# Patient Record
Sex: Female | Born: 1965 | Hispanic: Yes | Marital: Married | Smoking: Never smoker
Health system: Southern US, Community
[De-identification: ages and names within clinical notes are randomized; demographics above are authoritative.]

## PROBLEM LIST (undated history)

## (undated) HISTORY — PX: BREAST REDUCTION SURGERY: SHX8

## (undated) HISTORY — PX: OTHER SURGICAL HISTORY: SHX169

---

## 2014-02-19 ENCOUNTER — Encounter (HOSPITAL_COMMUNITY): Payer: Self-pay | Admitting: Emergency Medicine

## 2014-02-19 ENCOUNTER — Emergency Department (HOSPITAL_COMMUNITY): Payer: Medicaid - Out of State

## 2014-02-19 ENCOUNTER — Emergency Department (HOSPITAL_COMMUNITY)
Admission: EM | Admit: 2014-02-19 | Discharge: 2014-02-20 | Disposition: A | Discharge status: Home / Self Care | Payer: Medicaid - Out of State | Attending: Emergency Medicine | Admitting: Emergency Medicine

## 2014-02-19 DIAGNOSIS — R002 Palpitations: Secondary | ICD-10-CM | POA: Diagnosis present

## 2014-02-19 DIAGNOSIS — Z3202 Encounter for pregnancy test, result negative: Secondary | ICD-10-CM | POA: Insufficient documentation

## 2014-02-19 DIAGNOSIS — F419 Anxiety disorder, unspecified: Secondary | ICD-10-CM | POA: Insufficient documentation

## 2014-02-19 DIAGNOSIS — R0789 Other chest pain: Secondary | ICD-10-CM | POA: Diagnosis not present

## 2014-02-19 LAB — CBC WITH DIFFERENTIAL/PLATELET
Basophils Absolute: 0.1 10*3/uL (ref 0.0–0.1)
Basophils Relative: 1 % (ref 0–1)
Eosinophils Absolute: 0.4 10*3/uL (ref 0.0–0.7)
Eosinophils Relative: 4 % (ref 0–5)
HEMATOCRIT: 40.3 % (ref 36.0–46.0)
Hemoglobin: 12.9 g/dL (ref 12.0–15.0)
Lymphocytes Relative: 39 % (ref 12–46)
Lymphs Abs: 4.1 10*3/uL — ABNORMAL HIGH (ref 0.7–4.0)
MCH: 28.4 pg (ref 26.0–34.0)
MCHC: 32 g/dL (ref 30.0–36.0)
MCV: 88.8 fL (ref 78.0–100.0)
Monocytes Absolute: 0.8 10*3/uL (ref 0.1–1.0)
Monocytes Relative: 8 % (ref 3–12)
Neutro Abs: 4.9 10*3/uL (ref 1.7–7.7)
Neutrophils Relative %: 48 % (ref 43–77)
PLATELETS: 219 10*3/uL (ref 150–400)
RBC: 4.54 MIL/uL (ref 3.87–5.11)
RDW: 13.7 % (ref 11.5–15.5)
WBC: 10.3 10*3/uL (ref 4.0–10.5)

## 2014-02-19 LAB — BASIC METABOLIC PANEL
Anion gap: 11 (ref 5–15)
BUN: 10 mg/dL (ref 6–23)
CALCIUM: 9.3 mg/dL (ref 8.4–10.5)
CO2: 25 mEq/L (ref 19–32)
Chloride: 106 mEq/L (ref 96–112)
Creatinine, Ser: 0.64 mg/dL (ref 0.50–1.10)
GFR calc Af Amer: >90 mL/min (ref >90)
GFR calc non Af Amer: >90 mL/min (ref >90)
Glucose, Bld: 102 mg/dL — ABNORMAL HIGH (ref 70–99)
Potassium: 4 mEq/L (ref 3.7–5.3)
SODIUM: 142 meq/L (ref 137–147)

## 2014-02-19 LAB — I-STAT TROPONIN, ED: Troponin i, poc: 0 ng/mL (ref 0.00–0.08)

## 2014-02-19 MED ORDER — ACETAMINOPHEN 500 MG PO TABS
1000.0000 mg | ORAL_TABLET | Freq: Once | ORAL | Status: AC
  Administered 2014-02-19: 1000 mg via ORAL
  Filled 2014-02-19: qty 2

## 2014-02-19 NOTE — ED Notes (Signed)
Pt states for the past week she has been having heart palpitations, hot flashes where her face gets very hot, and pain in her left shoulder area and arm  Pt denies nausea or shortness of breath

## 2014-02-19 NOTE — ED Provider Notes (Signed)
CSN: [id number]     Arrival date & time 02/19/14  2033 History   First MD Initiated Contact with Patient 02/19/14 2201     Chief Complaint  Patient presents with  . Palpitations     (Consider location/radiation/quality/duration/timing/severity/associated sxs/prior Treatment) HPI Mrs. Beverly Phillips is a 48 year old female with no past medical history who reports having heart palpitations, chest discomfort with inspiration, and flushing of her face for approximately one week. Patient states these episodes have been intermittent, and have had a tendency to occur more frequently at night when she is trying to go to sleep. Patient also reports associated insomnia, and states that she recently moved from [place]. Patient states she is away from her family, and as he thinks about being away from home, and a new place, she tends to have more of these symptoms.patient states that when she lived in [place], she experienced these symptoms on several occasions. Patient states they were related to her anxiety at that time, however has not had these symptoms in several years until moving here recently.patient states her chest discomfort is in the center of her chest, and is only present with inspiration. She states she notices her heart beat when she lays flat at night. Patient states she has not noticed or been able to find any alleviating factors. Patient denies dizziness, lightheadedness, syncope, blurred vision, shortness of breath, nausea, vomiting, diarrhea, abdominal pain, dysuria. Patient reports having frequency of urination recently.  History reviewed. No pertinent past medical history. Past Surgical History  Procedure Laterality Date  . Breast reduction surgery    . Tummy tuck    . Cesarean section     Family History  Problem Relation Age of Onset  . Hypertension Mother   . Diabetes Father    History  Substance Use Topics  . Smoking status: Never Smoker   . Smokeless tobacco: Not on file  .  Alcohol Use: Yes     Comment: occ   OB History    No data available     Review of Systems  Constitutional: Negative for fever.  HENT: Negative for trouble swallowing.   Eyes: Negative for visual disturbance.  Respiratory: Positive for chest tightness. Negative for shortness of breath.   Cardiovascular: Positive for palpitations. Negative for chest pain.  Gastrointestinal: Negative for nausea, vomiting and abdominal pain.  Genitourinary: Negative for dysuria.  Musculoskeletal: Negative for neck pain.  Skin: Negative for rash.  Neurological: Negative for dizziness, weakness and numbness.  Psychiatric/Behavioral: Negative.       Allergies  Review of patient's allergies indicates no known allergies.  Home Medications   Prior to Admission medications   Medication Sig Start Date End Date Taking? Authorizing Provider  LORazepam (ATIVAN) 1 MG tablet Take 1 tablet (1 mg total) by mouth every 6 (six) hours as needed for anxiety. 02/20/14   Monte FantasiaJoseph W Delbra Zellars, PA-C   BP 110/68 mmHg  Pulse 82  Temp(Src) 98.1 F (36.7 C) (Oral)  Resp 20  SpO2 96%  LMP  Physical Exam  Constitutional: She appears well-developed and well-nourished. No distress.  HENT:  Head: Normocephalic and atraumatic.  Mouth/Throat: Oropharynx is clear and moist. No oropharyngeal exudate.  Eyes: Right eye exhibits no discharge. Left eye exhibits no discharge. No scleral icterus.  Neck: Normal range of motion.  Cardiovascular: Normal rate, regular rhythm, S1 normal, S2 normal and normal heart sounds.   No murmur heard. Pulses:      Radial pulses are 2+ on the right side, and 2+  on the left side.  Rate 85 on exam.  Pulmonary/Chest: Effort normal and breath sounds normal. No accessory muscle usage. No tachypnea. No respiratory distress.  Abdominal: Soft. Normal appearance and bowel sounds are normal. There is no tenderness. There is no rigidity, no guarding, no tenderness at McBurney's point and negative Murphy's sign.   Musculoskeletal: Normal range of motion. She exhibits no edema or tenderness.  Neurological: She is alert. No cranial nerve deficit. Coordination normal. GCS eye subscore is 4. GCS verbal subscore is 5. GCS motor subscore is 6.  Patient fully alert answering questions appropriately in full, clear sentences.  Skin: Skin is warm and dry. No rash noted. She is not diaphoretic.  Psychiatric: She has a normal mood and affect.  Nursing note and vitals reviewed.   ED Course  Procedures (including critical care time) Labs Review Labs Reviewed  CBC WITH DIFFERENTIAL - Abnormal; Notable for the following:    Lymphs Abs 4.1 (*)    All other components within normal limits  BASIC METABOLIC PANEL - Abnormal; Notable for the following:    Glucose, Bld 102 (*)    All other components within normal limits  URINALYSIS, ROUTINE W REFLEX MICROSCOPIC  PREGNANCY, URINE  I-STAT TROPOININ, ED    Imaging Review Dg Chest 2 View  02/19/2014   CLINICAL DATA:  Complaining of heart palpitations and left-sided chest pain for 1 week.  EXAM: CHEST  2 VIEW  COMPARISON:  None.  FINDINGS: The heart size and mediastinal contours are within normal limits. Both lungs are clear. The visualized skeletal structures are unremarkable. Surgical clips are noted in the right upper quadrant of the abdomen.  IMPRESSION: No active cardiopulmonary disease.   Electronically Signed   By: Rosalie GumsBeth  Brown M.D.   On: 02/19/2014 22:41     EKG Interpretation None      MDM   Final diagnoses:  Palpitations  Anxiousness    Patient here complaining of palpitations for the past week. Patient also reporting facial flushing, mild inspirational chest pain which has been intermittent. Patient stating she is asymptomatic right now while in the ER.Marland Kitchen. Patient states the symptoms are consistent with previous episodes of anxiety. Patient reporting insomnia,  And an increase in symptoms when she begins to worry. Although my suspicion is low for  cardiac risks, HEART score 0, PERC negative, we will rule out for ACS. Patient initially tachycardic at 103, however during interview and exam patient's heart rate remained in the mid 80s. Patient non-hypoxic, nontachypneic.   Labs unremarkable for any acute pathology. Radiographs unremarkable for any acute cardiopulmonary disease, UA unremarkable. Patient asymptomatic during ER stay. Patient given Tylenol for a mild headache. We'll discharge patient with a small amount of Ativan to help control symptoms on a when necessary basis. I strongly encouraged patient to follow up with a primary care physician, and provided her with a resource guide to help her find one. I discussed return precautions with patient and patient was agreeable to this plan. I encouraged patient to call or return to the ER should her symptoms persist, change, worsen or should she have any questions or concerns.  BP 110/68 mmHg  Pulse 82  Temp(Src) 98.1 F (36.7 C) (Oral)  Resp 20  SpO2 96%  LMP   Signed,  Ladona MowJoe Aadya Kindler, PA-C 2:35 AM  This patient discussed with Dr. Jerelyn ScottMartha Linker, M.D.  Monte FantasiaJoseph W Markel Mergenthaler, PA-C 02/20/14 0226  Monte FantasiaJoseph W Vasilios Ottaway, PA-C 02/20/14 [id number]  Ethelda ChickMartha K Linker, MD 02/20/14 [phone]

## 2014-02-20 LAB — URINALYSIS, ROUTINE W REFLEX MICROSCOPIC
Bilirubin Urine: NEGATIVE
GLUCOSE, UA: NEGATIVE mg/dL
Hgb urine dipstick: NEGATIVE
KETONES UR: NEGATIVE mg/dL
LEUKOCYTES UA: NEGATIVE
Nitrite: NEGATIVE
PH: 5 (ref 5.0–8.0)
Protein, ur: NEGATIVE mg/dL
Specific Gravity, Urine: 1.025 (ref 1.005–1.030)
Urobilinogen, UA: 0.2 mg/dL (ref 0.0–1.0)

## 2014-02-20 LAB — PREGNANCY, URINE: PREG TEST UR: NEGATIVE

## 2014-02-20 MED ORDER — LORAZEPAM 1 MG PO TABS
1.0000 mg | ORAL_TABLET | Freq: Four times a day (QID) | ORAL | Status: AC | PRN
Start: 2014-02-20 — End: ?

## 2014-02-20 NOTE — Discharge Instructions (Signed)
Palpitations A palpitation is the feeling that your heartbeat is irregular or is faster than normal. It may feel like your heart is fluttering or skipping a beat. Palpitations are usually not a serious problem. However, in some cases, you may need further medical evaluation. CAUSES  Palpitations can be caused by:  Smoking.  Caffeine or other stimulants, such as diet pills or energy drinks.  Alcohol.  Stress and anxiety.  Strenuous physical activity.  Fatigue.  Certain medicines.  Heart disease, especially if you have a history of irregular heart rhythms (arrhythmias), such as atrial fibrillation, atrial flutter, or supraventricular tachycardia.  An improperly working pacemaker or defibrillator. DIAGNOSIS  To find the cause of your palpitations, your health care provider will take your medical history and perform a physical exam. Your health care provider may also have you take a test called an ambulatory electrocardiogram (ECG). An ECG records your heartbeat patterns over a 24-hour period. You may also have other tests, such as:  Transthoracic echocardiogram (TTE). During echocardiography, sound waves are used to evaluate how blood flows through your heart.  Transesophageal echocardiogram (TEE).  Cardiac monitoring. This allows your health care provider to monitor your heart rate and rhythm in real time.  Holter monitor. This is a portable device that records your heartbeat and can help diagnose heart arrhythmias. It allows your health care provider to track your heart activity for several days, if needed.  Stress tests by exercise or by giving medicine that makes the heart beat faster. TREATMENT  Treatment of palpitations depends on the cause of your symptoms and can vary greatly. Most cases of palpitations do not require any treatment other than time, relaxation, and monitoring your symptoms. Other causes, such as atrial fibrillation, atrial flutter, or supraventricular  tachycardia, usually require further treatment. HOME CARE INSTRUCTIONS   Avoid:  Caffeinated coffee, tea, soft drinks, diet pills, and energy drinks.  Chocolate.  Alcohol.  Stop smoking if you smoke.  Reduce your stress and anxiety. Things that can help you relax include:  A method of controlling things in your body, such as your heartbeats, with your mind (biofeedback).  Yoga.  Meditation.  Physical activity such as swimming, jogging, or walking.  Get plenty of rest and sleep. SEEK MEDICAL CARE IF:   You continue to have a fast or irregular heartbeat beyond 24 hours.  Your palpitations occur more often. SEEK IMMEDIATE MEDICAL CARE IF:  You have chest pain or shortness of breath.  You have a severe headache.  You feel dizzy or you faint. MAKE SURE YOU:  Understand these instructions.  Will watch your condition.  Will get help right away if you are not doing well or get worse. Document Released: 04/01/2000 Document Revised: 04/09/2013 Document Reviewed: 06/03/2011 [facility] Patient Information 2015 [place], [place]. This information is not intended to replace advice given to you by your health care provider. Make sure you discuss any questions you have with your health care provider.  Generalized Anxiety Disorder Generalized anxiety disorder (GAD) is a mental disorder. It interferes with life functions, including relationships, work, and school. GAD is different from normal anxiety, which everyone experiences at some point in their lives in response to specific life events and activities. Normal anxiety actually helps [place] prepare for and get through these life events and activities. Normal anxiety goes away after the event or activity is over.  GAD causes anxiety that is not necessarily related to specific events or activities. It also causes excess anxiety in proportion to specific  events or activities. The anxiety associated with GAD is also difficult to control. GAD  can vary from mild to severe. People with severe GAD can have intense waves of anxiety with physical symptoms (panic attacks).  SYMPTOMS The anxiety and worry associated with GAD are difficult to control. This anxiety and worry are related to many life events and activities and also occur more days than not for 6 months or longer. People with GAD also have three or more of the following symptoms (one or more in children):  Restlessness.   Fatigue.  Difficulty concentrating.   Irritability.  Muscle tension.  Difficulty sleeping or unsatisfying sleep. DIAGNOSIS GAD is diagnosed through an assessment by your health care provider. Your health care provider will ask you questions aboutyour mood,physical symptoms, and events in your life. Your health care provider may ask you about your medical history and use of alcohol or drugs, including prescription medicines. Your health care provider may also do a physical exam and blood tests. Certain medical conditions and the use of certain substances can cause symptoms similar to those associated with GAD. Your health care provider may refer you to a mental health specialist for further evaluation. TREATMENT The following therapies are usually used to treat GAD:   Medication. Antidepressant medication usually is prescribed for long-term daily control. Antianxiety medicines may be added in severe cases, especially when panic attacks occur.   Talk therapy (psychotherapy). Certain types of talk therapy can be helpful in treating GAD by providing support, education, and guidance. A form of talk therapy called cognitive behavioral therapy can teach you healthy ways to think about and react to daily life events and activities.  Stress managementtechniques. These include yoga, meditation, and exercise and can be very helpful when they are practiced regularly. A mental health specialist can help determine which treatment is best for you. Some people see  improvement with one therapy. However, other people require a combination of therapies. Document Released: 07/30/2012 Document Revised: 08/19/2013 Document Reviewed: 07/30/2012 [facility] Patient Information 2015 [place], [place]. This information is not intended to replace advice given to you by your health care provider. Make sure you discuss any questions you have with your health care provider.   Emergency Department Resource Guide 1) Find a Doctor and Pay Out of Pocket Although you won't have to find out who is covered by your insurance plan, it is a good idea to ask around and get recommendations. You will then need to call the office and see if the doctor you have chosen will accept you as a new patient and what types of options they offer for patients who are self-pay. Some doctors offer discounts or will set up payment plans for their patients who do not have insurance, but you will need to ask so you aren't surprised when you get to your appointment.  2) Contact Your Local Health Department Not all health departments have doctors that can see patients for sick visits, but many do, so it is worth a call to see if yours does. If you don't know where your local health department is, you can check in your phone book. The CDC also has a tool to help you locate your state's health department, and many state websites also have listings of all of their local health departments.  3) Find a Walk-in Clinic If your illness is not likely to be very severe or complicated, you may want to try a walk in clinic. These are popping up all over the  country in pharmacies, drugstores, and shopping centers. They're usually staffed by nurse practitioners or physician assistants that have been trained to treat common illnesses and complaints. They're usually fairly quick and inexpensive. However, if you have serious medical issues or chronic medical problems, these are probably not your best option.  No Primary Care  Doctor: - Call Health Connect at  [phone] - they can help you locate a primary care doctor that  accepts your insurance, provides certain services, etc. - Physician Referral Service- [phone]  Chronic Pain Problems: Organization         Address  Phone   Notes  [facility]  [phone]) [phone] Patients need to be referred by their primary care doctor.   Medication Assistance: Organization         Address  Phone   Notes  [facility] Medication [facility] [address] [place], [place] [address] [phone] --Must be a resident of [facility] [place] -- Must have NO insurance coverage whatsoever (no Medicaid/ Medicare, etc.) -- The pt. MUST have a primary care doctor that directs their care regularly and follows them in the community   MedAssist  [phone]   [employer]  [phone]    Agencies that provide inexpensive medical care: Organization         Address  Phone   Notes  [facility]  [phone]) [phone]   [facility]    [phone]) [phone]   [facility] [address] [place], [place] [address] [phone]) [phone]   [facility] [address], [place] [phone]) [phone]   [facility]    [phone]) [phone]   [facility]    [phone]) [phone]   [facility]  [address], [place] Phone:  [phone](336) [phone], Fax:  [phone] Hours of Operation:  9 am - 6 pm, M-F.  Also accepts Medicaid/Medicare and self-pay.  [facility]  [address], [place] Phone: [phone](336) [phone], Fax: [phone](336) [phone]. Hours of Operation:  8:30 am - 5:30 pm, M-F.  Also accepts Medicaid and self-pay.  [facility] [place] [address], [place] Phone: [phone]) [phone]   [facility] [address], [place], [place] [phone], Ext. [phone] Mondays & Thursdays: 7-9 AM.  First 15 patients are seen on a first  come, first serve basis.    Medicaid-accepting [facility] Providers:  Organization         Address  Phone   Notes  [facility] [address], [place] [phone]) [phone] Also accepts self-pay patients.  [facility] [address], [place]  [phone]) [phone]   [facility] [address], [place] [phone]) [phone]   [facility] [address] [place] [address], [place] [phone]) [phone]   Renaye RakersVeita Bland [address], [place]   [phone] Only accepts [place] Access [place] patients after they have their name applied to their card.   Self-Pay (no insurance) in [facility]:  Organization         Address  Phone   Notes  Sickle Cell Patients, [facility] [address], [place] [phone]) [phone]   [facility] [place] [facility] [address], [place] [phone]) [phone]   [facility] [place]  [address]  [address], [place] [phone]   [facility]/Dr. Osei-Bonsu  [address], [place] or [address], [place] [phone] Phone number for both [place] and [place] locations is the same.  [facility] [address] [place] [phone]   [facility] [address], [place] or [address] [phone]   [facility] [address], [place] [phone], phone; [phone], fax Sees patients 1st and 3rd Saturday of every month.  Must not qualify for public or private insurance (i.e. Medicaid, Medicare, [place] Health Choice, Veterans' Benefits)  Household income should be no more than 200% of the poverty level The clinic cannot treat you if you are pregnant or think you are pregnant  Sexually transmitted diseases are not treated at the clinic.    Dental Care: Organization          Address  Phone  Notes  [facility] Department of [facility] [address] [place], [place] [phone] Accepts children up to age 26 who are enrolled in [place] or [place] Health Choice; pregnant women with a Medicaid card; and children who have applied for Medicaid or [place] Health Choice, but were declined, whose parents can pay a reduced fee at time of service.  [facility] Department of [facility]  [address], [place] [phone] Accepts children up to age 47 who are enrolled in [place] or [place] Health Choice; pregnant women with a Medicaid card; and children who have applied for Medicaid or [place] Health Choice, but were declined, whose parents can pay a reduced fee at time of service.  [facility]  [address] [place], [place] [phone] Patients are seen by appointment only. Walk-ins are not accepted. [facility] will see patients 23 years of age and older. Monday - Tuesday (8am-5pm) Most Wednesdays (8:30-5pm) $30 per visit, cash only  [facility] Adult Dental Access PROGRAM  [address], [facility] [phone] Patients are seen by appointment only. Walk-ins are not accepted. [facility] will see patients 70 years of age and older. One Wednesday Evening (Monthly: Volunteer Based).  $30 per visit, cash only  [employer]  [phone] for adults; Children under age 15, call Graduate Pediatric Dentistry at [phone]. Children aged 43-14, please call [phone] to request a pediatric application.  Dental services are provided in all areas of dental care including fillings, crowns and bridges, complete and partial dentures, implants, gum treatment, root canals, and extractions. Preventive care is also provided. Treatment is provided to both adults and children. Patients are selected via a lottery and there is often a waiting list.   [facility] [address] [place]  [phone] [email or web address]   [facility] [address] [place], [place] [phone], Ext. [phone] Second and Fourth Thursday of each month, opens at 6:30 AM; Clinic ends at 9 AM.  Patients are seen on a first-come first-served basis, and a limited number are seen during each clinic.   [facility]  [address] [place] [place], [place] [phone]   Eligibility Requirements You must have lived in [place], [place], or [place] counties for at least the last three months.   You cannot be eligible for state or federal sponsored [place], including [employer], [place], or [employer].   You  generally cannot be eligible for healthcare insurance through your employer.    How to apply: Eligibility screenings are held every Tuesday and Wednesday afternoon from 1:00 pm until 4:00 pm. You do not need an appointment for the interview!  [facility] [address], [place], [place] [phone]   [facility]  [phone]   [facility] Health Department  [phone]   [facility]  [phone]    Behavioral Health Resources in the Community: Intensive Outpatient Programs Organization         Address  Phone  Notes  [facility] [address] [place], [place] [phone]   [facility] [address] [place], [place] [phone]   [facility] [address] [place], [place]  [phone]   [facility] Health [address],  [place], [place] [phone] or [phone]   Substance Abuse Resources Organization         Address  Phone  Notes  Al[facility] Drug Services  [phone]   [facility]  [phone]   [facility] [place]  [phone]   Floydene Flock  [phone]   Residential & Outpatient Substance Abuse Program  [phone]   Psychological  Services Organization         Address  Phone  Notes  [facility]  [phone]   [facility] Services  [phone]   [facility] Mental Health [address] [place] [phone] or [phone]    Mobile Crisis Teams Organization         Address  Phone  Notes  [facility], Mobile Crisis Care Unit  [phone]   [facility]  [address] [place], [place] [phone]   Doristine Locks [address] [place] [phone]    Self-Help/Support Groups Organization         Address  Phone             Notes  [facility] [place] - variety of support groups  [phone]- I7437963 Call for more information  Narcotics Anonymous (NA), [facility] [address], [employer] [place]  2 meetings at this location   Statistician         Address  Phone  Notes  [facility] [address],    [place]  [phone]   [facility]  [address], [place] [address], [place], [place] [phone]   [facility] [address] [place], [place] [phone] Admissions: 8am-3pm M-F  [facility] [address]    [place], [place] [phone]   [facility] [address] [place], [place], [place] [phone]   [facility] [address],  [place], [place] [phone]   [facility] - Intensive Outpatient [address], [place], [place] [phone]   [facility] (Addiction Recovery [facility] [address] [place]  [place], [place] [phone] or [phone]   [facility] (RTS) [address], [place], [place] [phone] Accepts Medicaid  [facility] [place] [address],  [place] [phone] Substance Abuse/Addiction Treatment   [facility] Organization         Address  Phone  Notes  [facility]  [phone]   Angie Fava, PhD [address] [place], [place]   [phone] or [phone]   [facility]   [phone]  [address] [place], [place] [phone](336) [phone]   [facility] [address], [place], [place] [phone]) [phone] Insurance/Medicaid/sponsorship through [facility]  [facility] [address]                                    [place], [place] [phone]) [phone] Therapy/tele-psych/case  [facility] [address]   [place], [place] [phone](336) [phone]    Dr. Lolly MustacheArfeen  [phone](336) [phone]   [facility]  [employer] [facility] 1) [address], [place] 2) [address], [place] 3)  [address], [place] [phone]) [phone]  [phone](336) [phone]   [facility] Child Abuse Hotline [phone](336) [phone] or [phone]) [phone] (After Hours)

## 2014-04-09 ENCOUNTER — Emergency Department (HOSPITAL_COMMUNITY): Payer: Medicaid - Out of State

## 2014-04-09 ENCOUNTER — Emergency Department (HOSPITAL_COMMUNITY)
Admission: EM | Admit: 2014-04-09 | Discharge: 2014-04-09 | Disposition: A | Discharge status: Home / Self Care | Payer: Medicaid - Out of State | Attending: Emergency Medicine | Admitting: Emergency Medicine

## 2014-04-09 ENCOUNTER — Encounter (HOSPITAL_COMMUNITY): Payer: Self-pay | Admitting: Emergency Medicine

## 2014-04-09 DIAGNOSIS — R69 Illness, unspecified: Secondary | ICD-10-CM

## 2014-04-09 DIAGNOSIS — R0781 Pleurodynia: Secondary | ICD-10-CM | POA: Insufficient documentation

## 2014-04-09 DIAGNOSIS — H109 Unspecified conjunctivitis: Secondary | ICD-10-CM

## 2014-04-09 DIAGNOSIS — R058 Other specified cough: Secondary | ICD-10-CM

## 2014-04-09 DIAGNOSIS — R05 Cough: Secondary | ICD-10-CM

## 2014-04-09 DIAGNOSIS — J111 Influenza due to unidentified influenza virus with other respiratory manifestations: Secondary | ICD-10-CM | POA: Insufficient documentation

## 2014-04-09 DIAGNOSIS — Z79899 Other long term (current) drug therapy: Secondary | ICD-10-CM | POA: Insufficient documentation

## 2014-04-09 LAB — I-STAT CHEM 8, ED
BUN: 13 mg/dL (ref 6–23)
CHLORIDE: 102 meq/L (ref 96–112)
CREATININE: 0.8 mg/dL (ref 0.50–1.10)
Calcium, Ion: 1.16 mmol/L (ref 1.12–1.23)
Glucose, Bld: 126 mg/dL — ABNORMAL HIGH (ref 70–99)
HEMATOCRIT: 42 % (ref 36.0–46.0)
Hemoglobin: 14.3 g/dL (ref 12.0–15.0)
POTASSIUM: 4.1 mmol/L (ref 3.5–5.1)
Sodium: 141 mmol/L (ref 135–145)
TCO2: 23 mmol/L (ref 0–100)

## 2014-04-09 LAB — CBC WITH DIFFERENTIAL/PLATELET
Basophils Absolute: 0.1 10*3/uL (ref 0.0–0.1)
Basophils Relative: 1 % (ref 0–1)
Eosinophils Absolute: 0.4 10*3/uL (ref 0.0–0.7)
Eosinophils Relative: 4 % (ref 0–5)
HCT: 40.5 % (ref 36.0–46.0)
HEMOGLOBIN: 13.2 g/dL (ref 12.0–15.0)
LYMPHS ABS: 3.3 10*3/uL (ref 0.7–4.0)
LYMPHS PCT: 29 % (ref 12–46)
MCH: 28.4 pg (ref 26.0–34.0)
MCHC: 32.6 g/dL (ref 30.0–36.0)
MCV: 87.1 fL (ref 78.0–100.0)
Monocytes Absolute: 0.7 10*3/uL (ref 0.1–1.0)
Monocytes Relative: 6 % (ref 3–12)
NEUTROS ABS: 6.8 10*3/uL (ref 1.7–7.7)
NEUTROS PCT: 60 % (ref 43–77)
Platelets: 249 10*3/uL (ref 150–400)
RBC: 4.65 MIL/uL (ref 3.87–5.11)
RDW: 13.6 % (ref 11.5–15.5)
WBC: 11.3 10*3/uL — AB (ref 4.0–10.5)

## 2014-04-09 MED ORDER — CIPROFLOXACIN HCL 0.3 % OP SOLN
1.0000 [drp] | Freq: Once | OPHTHALMIC | Status: AC
  Administered 2014-04-09: 1 [drp] via OPHTHALMIC
  Filled 2014-04-09: qty 2.5

## 2014-04-09 MED ORDER — CIPROFLOXACIN HCL 0.3 % OP OINT: TOPICAL_OINTMENT | OPHTHALMIC | Status: AC

## 2014-04-09 MED ORDER — HYDROCODONE-ACETAMINOPHEN 5-325 MG PO TABS: ORAL_TABLET | ORAL | Status: AC

## 2014-04-09 NOTE — ED Provider Notes (Signed)
CSN: [id number]     Arrival date & time 04/09/14  1318 History   First MD Initiated Contact with Patient 04/09/14 1337     Chief Complaint  Patient presents with  . Sore Throat  . Generalized Body Aches  . Conjunctivitis  . congestion      (Consider location/radiation/quality/duration/timing/severity/associated sxs/prior Treatment) HPI   Beverly Phillips is a 48 y.o. female complaining of productive cough, pleuritic chest pain, shortness of breath, diffuse myalgia, headache, sore throat for approximately one month, significantly worsening over the last week. Patient denies fever, chills but states that she has been taking Motrin regularly. Patient is also reporting a redness to the right eye which she noticed this morning associated with discomfort and crusting when she woke up. Patient denies any contact lens use. She denies focal abdominal pain, nausea, vomiting, decreased by mouth intake or bladder habits.  History reviewed. No pertinent past medical history. Past Surgical History  Procedure Laterality Date  . Breast reduction surgery    . Tummy tuck    . Cesarean section     Family History  Problem Relation Age of Onset  . Hypertension Mother   . Diabetes Father    History  Substance Use Topics  . Smoking status: Never Smoker   . Smokeless tobacco: Not on file  . Alcohol Use: Yes     Comment: occ   OB History    No data available     Review of Systems  10 systems reviewed and found to be negative, except as noted in the HPI.   Allergies  Review of patient's allergies indicates no known allergies.  Home Medications   Prior to Admission medications   Medication Sig Start Date End Date Taking? Authorizing Provider  guaiFENesin (ROBITUSSIN) 100 MG/5ML SOLN Take 10 mLs by mouth every 4 (four) hours as needed for cough or to loosen phlegm.   Yes Historical Provider, MD  ciprofloxacin (CILOXAN) 0.3 % ophthalmic ointment 1-2 drops in affected eye every 2 hours while awake  for 2 days then every 4 hours while awake for 5 days. 04/09/14   Gifford Ballon, PA-C  HYDROcodone-acetaminophen (NORCO/VICODIN) 5-325 MG per tablet Take 1-2 tablets by mouth every 6 hours as needed for pain and/or cough. 04/09/14   Teven Mittman, PA-C  LORazepam (ATIVAN) 1 MG tablet Take 1 tablet (1 mg total) by mouth every 6 (six) hours as needed for anxiety. 02/20/14   Monte FantasiaJoseph W Mintz, PA-C   BP 112/71 mmHg  Pulse 92  Temp(Src) 98.6 F (37 C) (Oral)  Resp 20  SpO2 97% Physical Exam  Constitutional: She is oriented to person, place, and time. She appears well-developed and well-nourished. No distress.  HENT:  Head: Normocephalic and atraumatic.  Mouth/Throat: Oropharynx is clear and moist.  Eyes: EOM are normal. Pupils are equal, round, and reactive to light.  Right eye with mild conjunctival injection, pupils equal round reactive to light, extraocular movement is intact, no significant discharge, no lid swelling or proptosis.  Left eye with medial pterygium.  Neck: Normal range of motion.  Cardiovascular: Normal rate, regular rhythm and intact distal pulses.   Pulmonary/Chest: Effort normal and breath sounds normal. No stridor. No respiratory distress. She has no wheezes. She has no rales. She exhibits no tenderness.  Abdominal: Soft. Bowel sounds are normal. She exhibits no distension and no mass. There is no tenderness. There is no rebound and no guarding.  Musculoskeletal: Normal range of motion.  Neurological: She is alert and oriented to  person, place, and time.  Psychiatric: She has a normal mood and affect.  Nursing note and vitals reviewed.   ED Course  Procedures (including critical care time) Labs Review Labs Reviewed  CBC WITH DIFFERENTIAL - Abnormal; Notable for the following:    WBC 11.3 (*)    All other components within normal limits  I-STAT CHEM 8, ED - Abnormal; Notable for the following:    Glucose, Bld 126 (*)    All other components within normal limits     Imaging Review Dg Chest 2 View  04/09/2014   CLINICAL DATA:  One month history of congestion  EXAM: CHEST  2 VIEW  COMPARISON:  February 19, 2014  FINDINGS: There is no edema or consolidation. The heart size and pulmonary vascularity are normal. No adenopathy. There is upper lumbar levoscoliosis.  IMPRESSION: No edema or consolidation.   Electronically Signed   By: Bretta BangWilliam  Woodruff M.D.   On: 04/09/2014 14:00     EKG Interpretation None      MDM   Final diagnoses:  Productive cough  Conjunctivitis of right eye  Influenza-like illness    Filed Vitals:   04/09/14 1324  BP: 112/71  Pulse: 92  Temp: 98.6 F (37 C)  TempSrc: Oral  Resp: 20  SpO2: 97%    Medications  ciprofloxacin (CILOXAN) 0.3 % ophthalmic solution 1 drop (1 drop Right Eye Given 04/09/14 1413)    Beverly Phillips is a pleasant 48 y.o. female presenting with rhinorrhea, productive cough, generalized body aches and injection and irritation to right eye onset this morning. Patient is afebrile, well-appearing. She's been taking Motrin regularly, does not know she's had fever at home. Chest x-ray ordered to rule out pneumonia. A sick blood work shows no significant abnormality aside from a mild cytosis of 11.3. Chest x-ray with no infiltrate. Patient is not a contact lens wearer. Will start her on Cipro ophthalmic in the ED.   Evaluation does not show pathology that would require ongoing emergent intervention or inpatient treatment. Pt is hemodynamically stable and mentating appropriately. Discussed findings and plan with patient/guardian, who agrees with care plan. All questions answered. Return precautions discussed and outpatient follow up given.   Discharge Medication List as of 04/09/2014  2:16 PM    START taking these medications   Details  ciprofloxacin (CILOXAN) 0.3 % ophthalmic ointment 1-2 drops in affected eye every 2 hours while awake for 2 days then every 4 hours while awake for 5 days., Print     HYDROcodone-acetaminophen (NORCO/VICODIN) 5-325 MG per tablet Take 1-2 tablets by mouth every 6 hours as needed for pain and/or cough., Print             Wynetta Emeryicole Kaidin Boehle, PA-C 04/09/14 1448  Gwyneth SproutWhitney Plunkett, MD 04/09/14 2039

## 2014-04-09 NOTE — Discharge Instructions (Signed)
Follow with the eye doctor in the next 24 to 28 hours  Do not reuse your contact lenses and do not use any contact lenses until you are cleared by the eye doctor.  Apply the cipro antibiotic drops every 2 hours while you are awake for  2 days, then every 4 hours for 5 more days.   Wash your hands frequently and try to keep your hands away from the affected eye(s).   You should be feeling some improvement by 48 hours. If symptoms worsen, you develop pain, change in your vision or no improvement in 48 hours please follow with the ophthalmologist or, if that is not possible, return to the emergency room for a recheck.  Take vicodin for breakthrough pain, do not drink alcohol, drive, care for children or do other critical tasks while taking vicodin.  Do not hesitate to return to the emergency room for any new, worsening or concerning symptoms.  Please obtain primary care using resource guide below. But the minute you were seen in the emergency room and that they will need to obtain records for further outpatient management.  Beverly Phillips  Emergency Department Resource Guide 1) Find a Doctor and Pay Out of Pocket Although you won't have to find out who is covered by your insurance plan, it is a good idea to ask around and get recommendations. You will then need to call the office and see if the doctor you have chosen will accept you as a new patient and what types of options they offer for patients who are self-pay. Some doctors offer discounts or will set up payment plans for their patients who do not have insurance, but you will need to ask so you aren't surprised when you get to your appointment.  2) Contact Your Local Health Department Not all health departments have doctors that can see patients for sick visits, but many do, so it is worth a call to see if yours does. If you don't know where your local health department is, you can check in your phone book. The CDC also has a tool to help you  locate your state's health department, and many state websites also have listings of all of their local health departments.  3) Find a Walk-in Clinic If your illness is not likely to be very severe or complicated, you may want to try a walk in clinic. These are popping up all over the country in pharmacies, drugstores, and shopping centers. They're usually staffed by nurse practitioners or physician assistants that have been trained to treat common illnesses and complaints. They're usually fairly quick and inexpensive. However, if you have serious medical issues or chronic medical problems, these are probably not your best option.  No Primary Care Doctor: - Call Health Connect at  [phone] - they can help you locate a primary care doctor that  accepts your insurance, provides certain services, etc. - Physician Referral Service- [phone]  Chronic Pain Problems: Organization         Address  Phone   Notes  [facility]  [phone] Patients need to be referred by their primary care doctor.   Medication Assistance: Organization         Address  Phone   Notes  [facility] Medication [facility] [place] [address] [place] [place], [place] [address] [phone] --Must be a resident of [facility] -- Must have NO insurance coverage whatsoever (no Medicaid/ Medicare, etc.) -- The pt. MUST have a  primary care doctor that directs their care regularly and follows them in the community   MedAssist  [phone]   [employer]  [phone]    Agencies that provide inexpensive medical care: Organization         Address  Phone   Notes  [facility]  [phone]   [facility]    [phone]) [phone]   [facility] [address] [place], [place] [address] [phone]) [phone]   [facility] [place] [address], [place] [phone]   [facility]anned Parenthood    [phone]   [facility]    [phone]   [facility]  [address], [place] Phone:  [phone], Fax:  [phone] Hours of Operation:  9 am - 6 pm, M-F.  Also accepts Medicaid/Medicare and self-pay.  [facility]  [address], [place] Phone: [phone], Fax: [phone]. Hours of Operation:  8:30 am - 5:30 pm, M-F.  Also accepts Medicaid and self-pay.  [facility] [place] [address], [place] Phone: [phone](336) [phone]   [facility] [address], [place], [place] [phone], Ext. [phone] Mondays & Thursdays: 7-9 AM.  First 15 patients are seen on a first come, first serve basis.    Medicaid-accepting [facility] Providers:  Organization         Address  Phone   Notes  [facility] [address], [place] [phone] Also accepts self-pay patients.  [facility] [address], [place]  [phone]   [facility] [address], [place] [phone]   [facility] [address] [place] [address], [place] [phone]   Renaye RakersVeita Bland [address], [place]   [phone] Only accepts [place] Access [place] patients after they have their name applied to their card.   Self-Pay (no insurance) in [facility]:  Organization         Address  Phone   Notes  Sickle Cell Patients, [facility] [address], [place] [phone]) [phone]   [facility] [address], [place] [phone]   [facility] [place]  [address], [place] [phone]   [facility]/Dr. Osei-Bonsu  [address] [place] [address], [place] or [phone], [place] [phone] Phone number for both [place] and [place] locations is the same.  [facility] [address],  [place] [phone]) [phone]   [facility]  [address] [place] [address], [place] or [address] [phone]) [phone]   [facility] [address], [place] [phone], phone; [phone](336) [phone], fax Sees patients 1st and 3rd Saturday of every month.  Must not qualify for public or private insurance (i.e. Medicaid, Medicare, [place] Health Choice, Veterans' Benefits)  Household income should be no more than 200% of the poverty level The clinic cannot treat you if you are pregnant or think you are pregnant  Sexually transmitted diseases are not treated at the clinic.    Dental Care: Organization         Address  Phone  Notes  [facility] [address], [place] [phone]  [phone] Accepts children up to age 46 who are enrolled in Medicaid or [place] Health Choice; pregnant women with a Medicaid card; and children who have applied for Medicaid or [place] Health Choice, but were declined, whose parents can pay a reduced fee at time of service.  [facility] [place]  [address], [place] [phone]) [phone] Accepts children up to age 46 who are enrolled in [place]icaid or [place] Health Choice; pregnant women with a Medicaid card; and children who have applied for Medicaid or [place] Health Choice, but were declined, whose parents can pay a reduced fee at time of service.  [facility]  [address], [place] [phone]) [phone] Patients are seen by appointment only. Walk-ins are not accepted. [facility] will see patients 46 years of age and older. Monday - Tuesday (8am-5pm) Most Wednesdays (8:30-5pm) $30 per visit, cash only  [facility]  [address], [facility] [phone] Patients are seen by appointment only. Walk-ins are not accepted. [facility] will see patients 46 years of age and older. One Wednesday Evening  (Monthly: Volunteer Based).  $30 per visit, cash only  [employer]  [phone] for adults; Children under age 744, call Graduate Pediatric Dentistry at [phone]. Children aged 254-14, please call [phone] to request a pediatric application.  Dental services are provided in all areas of dental care including fillings, crowns and bridges, complete and partial dentures, implants, gum treatment, root canals, and extractions. Preventive care is also provided. Treatment is provided to both adults and children. Patients are selected via a lottery and there is often a waiting list.   [facility] [address], [place]  [phone] [email or web address]   [facility] [address], [place], [place] [phone], Ext. [phone] Second and Fourth Thursday of each month, opens at 6:30 AM; Clinic ends at 9 AM.  Patients are seen on a first-come first-served basis, and a limited number are seen during each clinic.   [facility]  [address], [place], [place] [phone]) [phone]   Eligibility Requirements You must have lived in [place], [place], or [place] counties for at least the last three months.   You cannot be eligible for state or federal sponsored National Cityhealthcare insurance, including CIGNAVeterans Administration, [place]icaid, or [employer]Medicare.   You generally cannot be eligible for healthcare insurance through your employer.    How to apply: Eligibility screenings are held every Tuesday and Wednesday afternoon from 1:00 pm until 4:00 pm. You do not need an appointment for the interview!  [facility] [address], [place], [place] [phone]   [facility]  [phone]   [facility]  [phone]   [facility]  [phone]    Behavioral Health Resources in the Community: Intensive Outpatient Programs Organization         Address  Phone  Notes  [facility] [address], [place], [place] [phone]   [facility] [address], [place], [place] [phone]   [facility] [address], [place], [place]  [phone]   [facility] [address],  [place], [place] [phone] or [phone]   Substance Abuse Resources Organization         Address  Phone  Notes  [facility] Drug Services  [phone]  [facility]  [phone]   [facility]  [phone]   Floydene Flock  [phone]   Residential & Outpatient Substance Abuse Program  [phone]   Psychological Services Organization         Address  Phone  Notes  [facility] Behavioral Health  [phone]   [facility]  [phone]   [facility] [address], [place] [phone] or [phone]    Mobile Crisis Teams Organization         Address  Phone  Notes  [facility], Mobile Crisis Care Unit  [phone]   [facility]  [address] [place], [place] [phone]   Doristine Locks [address] [place] [phone]    Self-Help/Support Groups Organization         Address  Phone             Notes  [facility] [place] - variety of support groups  [phone]- I7437963 Call for more information  Narcotics Anonymous (NA), [facility] [address], [employer] [place]  2 meetings at this location   Statistician         Address  Phone  Notes  [facility] [address],    [place]  [phone]   [facility]  [address] [place] [address], [place], [place] [phone]   [facility] [address] [place], [place] [phone] Admissions: 8am-3pm M-F  [facility] [address],    [place], [place] [phone]   [facility] [address] [place],  [place], [place] [phone]   [facility] [address],  [place], [place] [phone]   [facility] - Intensive Outpatient [address], [place], [place] [phone]   [facility] ([facility] [address] [place].,  [place], [place] [phone] or [phone]   [facility] ([facility]) [address] [place], [place] [phone] Accepts Medicaid  [facility] [place] [address]  [place] [phone] Substance Abuse/Addiction Treatment   [facility] Organization         Address  Phone  Notes  [facility]  [phone]   Angie Fava, PhD [address] [place], [place]   [phone] or [phone]   [facility] Behavioral   [address] [place], [place] [phone]   [facility] [address], [place], [place] [phone] Insurance/Medicaid/sponsorship through [facility] and Families [address]                                    [place], [place] [phone] Therapy/tele-psych/case  [facility] [address], [place] [phone]    Dr. Lolly Mustache  [phone]   [facility] [place]  [employer] [facility] Dept. 1) [address]  2) [address] [place] 3)  [address], [place] [phone]  [phone]   [facility] Child Abuse Hotline 905-830-2137 or [phone] (After Hours)

## 2014-04-09 NOTE — ED Notes (Signed)
Pt states for more than 1 month she has been congested, having generalized body aches, sore throat. Pt also has pinkish tinge to right eye, job worried about pink eye.

## 2014-04-09 NOTE — ED Notes (Signed)
Pt to cxr

## 2014-07-25 ENCOUNTER — Ambulatory Visit: Payer: Medicaid - Out of State

## 2016-07-01 IMAGING — CR DG CHEST 2V
2 series · 2 of 2 positions shown · non-contrast
Comparison: February 19, 2014

CLINICAL DATA: One month history of congestion

EXAM:
CHEST  2 VIEW

[w chest pa]
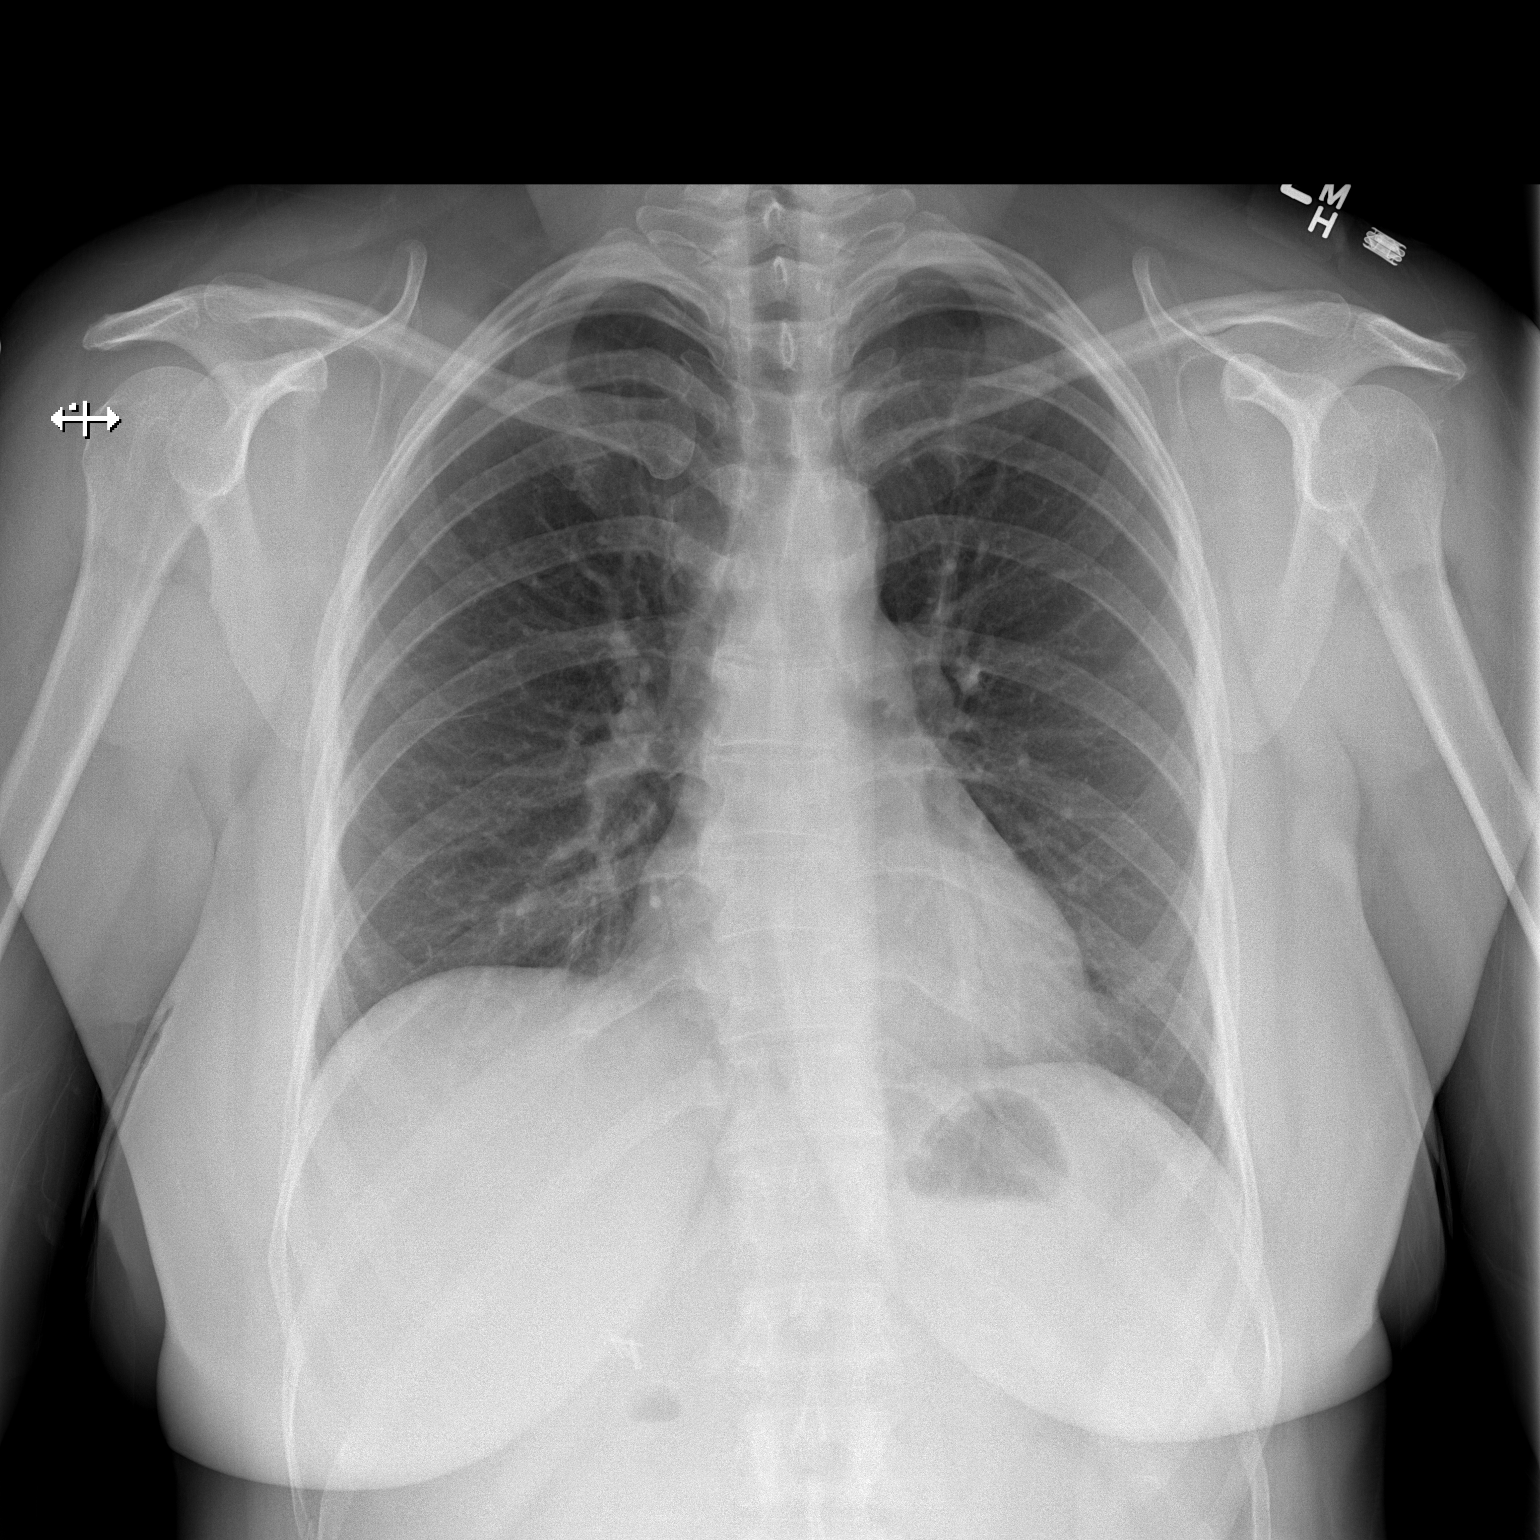

[w chest lat]
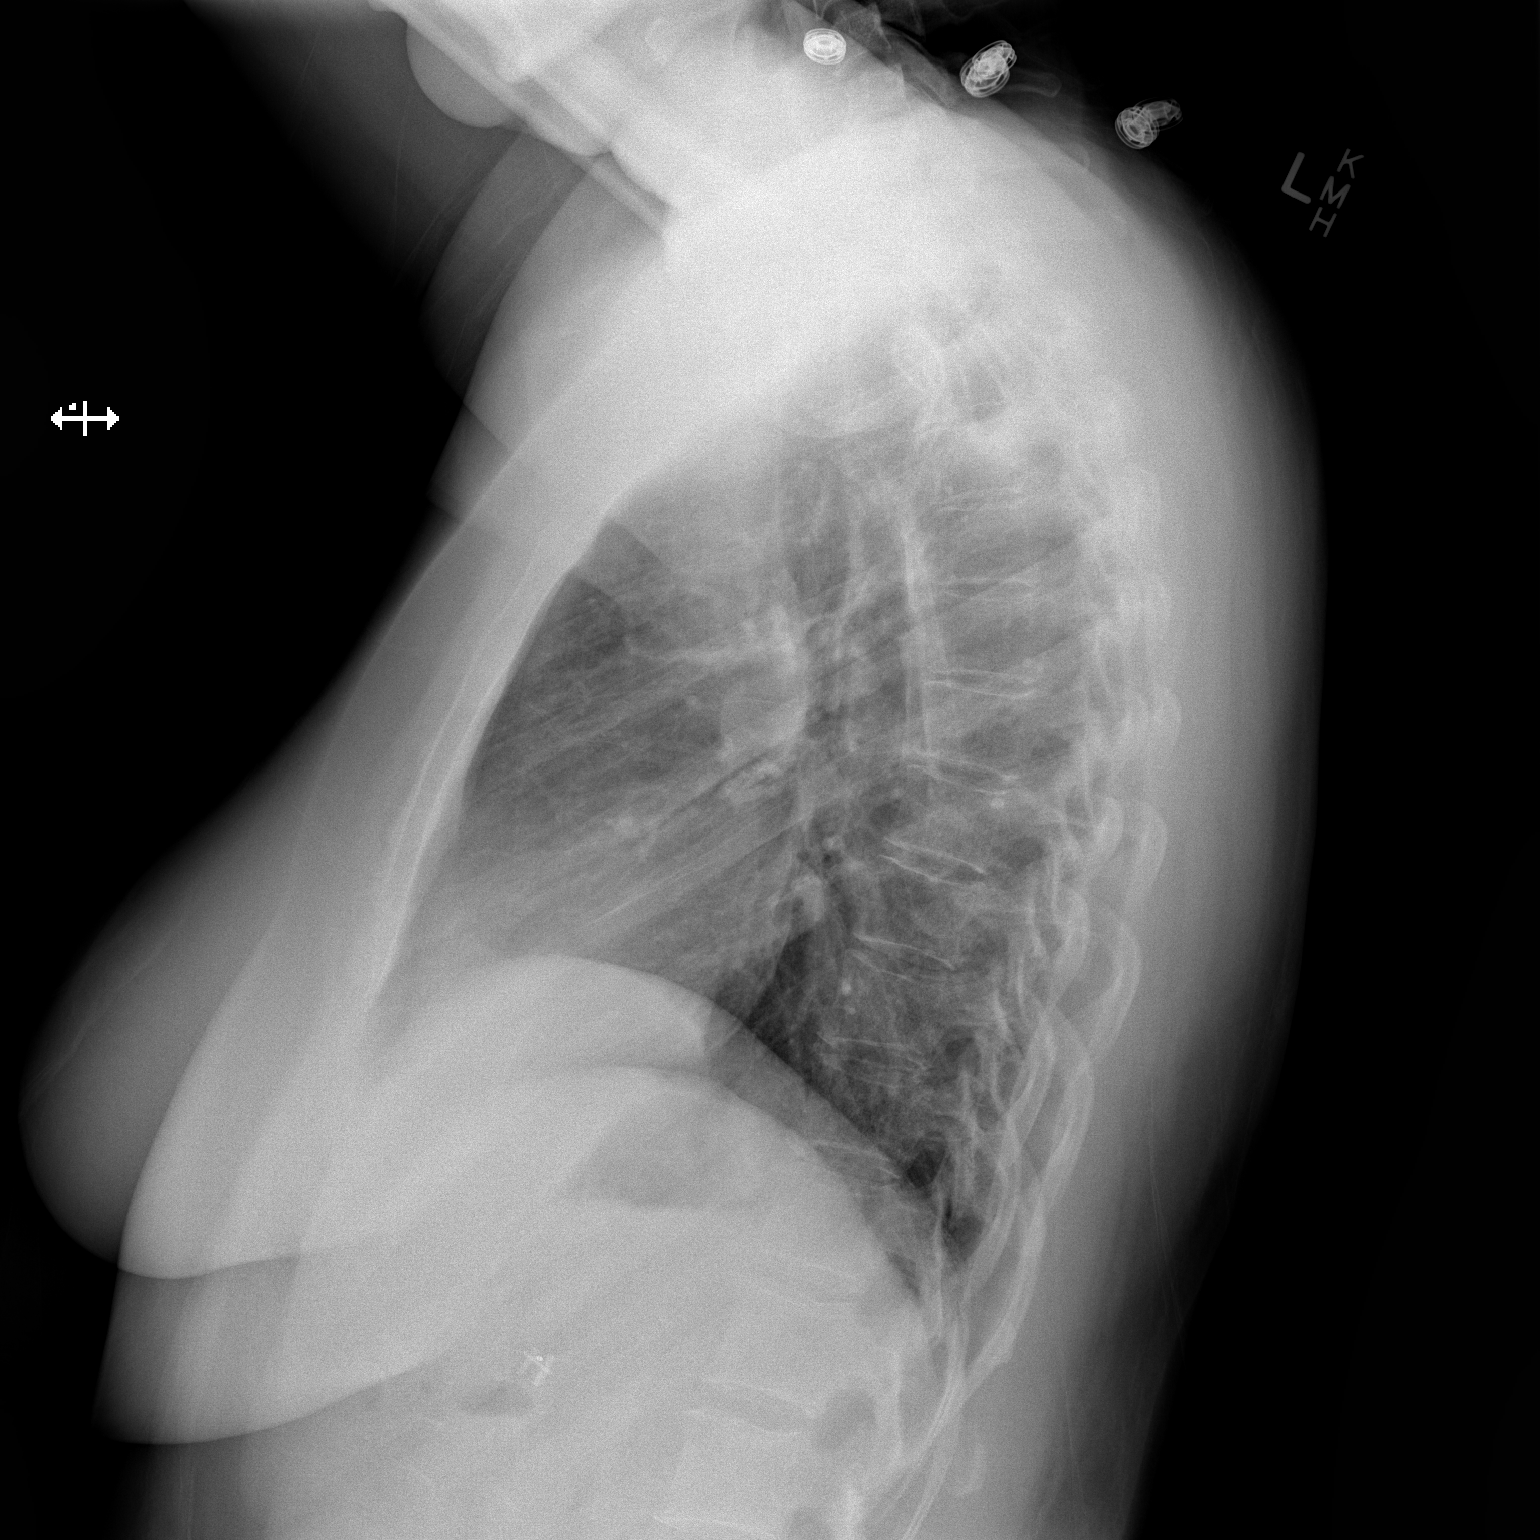

[2 of 2 positions shown; findings below may reference images not displayed]

FINDINGS: There is no edema or consolidation. The heart size and pulmonary
vascularity are normal. No adenopathy. There is upper lumbar
levoscoliosis.
IMPRESSION: No edema or consolidation.
